# Patient Record
Sex: Male | Born: 1950
Health system: Southern US, Community
[De-identification: ages and names within clinical notes are randomized; demographics above are authoritative.]

## PROBLEM LIST (undated history)

## (undated) DIAGNOSIS — R682 Dry mouth, unspecified: Secondary | ICD-10-CM

## (undated) DIAGNOSIS — I10 Essential (primary) hypertension: Secondary | ICD-10-CM

## (undated) DIAGNOSIS — K117 Disturbances of salivary secretion: Secondary | ICD-10-CM

## (undated) DIAGNOSIS — E782 Mixed hyperlipidemia: Secondary | ICD-10-CM

## (undated) DIAGNOSIS — Z833 Family history of diabetes mellitus: Secondary | ICD-10-CM

## (undated) DIAGNOSIS — Z1211 Encounter for screening for malignant neoplasm of colon: Secondary | ICD-10-CM

## (undated) DIAGNOSIS — R7301 Impaired fasting glucose: Secondary | ICD-10-CM

## (undated) DIAGNOSIS — J45909 Unspecified asthma, uncomplicated: Secondary | ICD-10-CM

## (undated) DIAGNOSIS — R7303 Prediabetes: Secondary | ICD-10-CM

## (undated) DIAGNOSIS — H9319 Tinnitus, unspecified ear: Secondary | ICD-10-CM

## (undated) DIAGNOSIS — Z0000 Encounter for general adult medical examination without abnormal findings: Secondary | ICD-10-CM

## (undated) DIAGNOSIS — R252 Cramp and spasm: Secondary | ICD-10-CM

## (undated) HISTORY — DX: Encounter for screening for malignant neoplasm of colon: Z12.11

## (undated) HISTORY — DX: Impaired fasting glucose: R73.01

## (undated) HISTORY — DX: Mixed hyperlipidemia: E78.2

## (undated) HISTORY — DX: Tinnitus, unspecified ear: H93.19

## (undated) HISTORY — PX: RHYTIDECTOMY NECK / CHEEK / CHIN: SUR1286

## (undated) HISTORY — DX: Cramp and spasm: R25.2

## (undated) HISTORY — DX: Essential (primary) hypertension: I10

## (undated) HISTORY — DX: Dry mouth, unspecified: R68.2

## (undated) HISTORY — DX: Unspecified asthma, uncomplicated: J45.909

## (undated) HISTORY — DX: Family history of diabetes mellitus: Z83.3

## (undated) HISTORY — DX: Prediabetes: R73.03

## (undated) HISTORY — DX: Encounter for general adult medical examination without abnormal findings: Z00.00

## (undated) HISTORY — DX: Disturbances of salivary secretion: K11.7

---

## 2012-08-18 DIAGNOSIS — R7303 Prediabetes: Secondary | ICD-10-CM | POA: Insufficient documentation

## 2012-08-18 DIAGNOSIS — J45909 Unspecified asthma, uncomplicated: Secondary | ICD-10-CM

## 2012-08-18 DIAGNOSIS — R7301 Impaired fasting glucose: Secondary | ICD-10-CM | POA: Insufficient documentation

## 2012-08-18 DIAGNOSIS — Z833 Family history of diabetes mellitus: Secondary | ICD-10-CM

## 2012-08-18 DIAGNOSIS — I10 Essential (primary) hypertension: Secondary | ICD-10-CM | POA: Insufficient documentation

## 2012-08-18 DIAGNOSIS — H9319 Tinnitus, unspecified ear: Secondary | ICD-10-CM

## 2012-08-18 DIAGNOSIS — E782 Mixed hyperlipidemia: Secondary | ICD-10-CM

## 2012-08-18 HISTORY — DX: Family history of diabetes mellitus: Z83.3

## 2012-08-18 HISTORY — DX: Mixed hyperlipidemia: E78.2

## 2012-08-18 HISTORY — DX: Prediabetes: R73.03

## 2012-08-18 HISTORY — DX: Tinnitus, unspecified ear: H93.19

## 2012-08-18 HISTORY — DX: Impaired fasting glucose: R73.01

## 2012-08-18 HISTORY — DX: Essential (primary) hypertension: I10

## 2012-08-18 HISTORY — DX: Unspecified asthma, uncomplicated: J45.909

## 2013-02-17 DIAGNOSIS — Z1211 Encounter for screening for malignant neoplasm of colon: Secondary | ICD-10-CM

## 2013-02-17 DIAGNOSIS — Z0000 Encounter for general adult medical examination without abnormal findings: Secondary | ICD-10-CM

## 2013-02-17 HISTORY — DX: Encounter for screening for malignant neoplasm of colon: Z12.11

## 2013-02-17 HISTORY — DX: Encounter for general adult medical examination without abnormal findings: Z00.00

## 2014-09-17 DIAGNOSIS — R252 Cramp and spasm: Secondary | ICD-10-CM | POA: Insufficient documentation

## 2014-09-17 DIAGNOSIS — R682 Dry mouth, unspecified: Secondary | ICD-10-CM

## 2014-09-17 DIAGNOSIS — K117 Disturbances of salivary secretion: Secondary | ICD-10-CM | POA: Insufficient documentation

## 2014-09-17 HISTORY — DX: Dry mouth, unspecified: R68.2

## 2014-09-17 HISTORY — DX: Cramp and spasm: R25.2

## 2014-09-17 HISTORY — DX: Disturbances of salivary secretion: K11.7

## 2015-06-03 ENCOUNTER — Other Ambulatory Visit: Payer: Self-pay | Admitting: Nephrology

## 2015-06-03 DIAGNOSIS — N2889 Other specified disorders of kidney and ureter: Secondary | ICD-10-CM

## 2015-06-10 ENCOUNTER — Ambulatory Visit
Admission: RE | Admit: 2015-06-10 | Discharge: 2015-06-10 | Disposition: A | Discharge status: Home / Self Care | Payer: No Typology Code available for payment source | Source: Ambulatory Visit | Attending: Nephrology | Admitting: Nephrology

## 2015-06-10 DIAGNOSIS — N2889 Other specified disorders of kidney and ureter: Secondary | ICD-10-CM

## 2015-06-22 ENCOUNTER — Encounter: Payer: Self-pay | Admitting: Urology

## 2015-06-22 ENCOUNTER — Ambulatory Visit (INDEPENDENT_AMBULATORY_CARE_PROVIDER_SITE_OTHER): Payer: No Typology Code available for payment source | Admitting: Urology

## 2015-06-22 DIAGNOSIS — Q6100 Congenital renal cyst, unspecified: Secondary | ICD-10-CM

## 2015-06-22 DIAGNOSIS — N281 Cyst of kidney, acquired: Secondary | ICD-10-CM

## 2015-06-22 NOTE — Progress Notes (Signed)
06/22/2015 2:54 PM   Alejandro Evans 20-Feb-1951 [id number]  Referring provider: Marina Goodell, MD [address] [place], [place] [address]  Chief Complaint  Patient presents with  . Renal Cyst    New Patient    HPI:  The patient is a 64 year old male with no genitourinary past medical history presenting for evaluation of a 4.7 cm left renal cyst. It appears to be hemorrhagic on renal ultrasound and MRI without contrast. The patient cannot tolerate iodine contrast and has too low of GFR for gadolinium. He originally got the ultrasound for his CKD. He has had no symptoms. He denies any urinary issues including no frequency, urgency, incontinence, nocturia, hematuria, or erectile dysfunction.  PMH: Past Medical History  Diagnosis Date  . Benign essential HTN 08/18/2012  . 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 Metoprolol Will continue to follow Consider PFT once has insurance.    . Disturbance of salivary secretion 09/17/2014  . Dry mouth 09/17/2014    Last Assessment & Plan:  ? SE to metoprolol Will try biotene and consider changing regimen of bp meds once has insurance coverage.    . Elevated fasting blood sugar 08/18/2012    Last Assessment & Plan:  See htn tab   . Borderline diabetes 08/18/2012  . Cramps of lower extremity 09/17/2014    Last Assessment & Plan:  Likely due to new job and lisinopril hctz Coconut or tonic water at bedtime.    . Family history of diabetes mellitus 08/18/2012  . 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 Encounter for general adult medical examination without abnormal findings 02/17/2013    Last Assessment & Plan:  Generally he appears to be doing fairly well. I recommend a tetanus booster today. He was given information on shingles vaccination. He is due for colonoscopy and a referral was made. He is encouraged to continue exercising  regularly. Consider wearing a heart monitor.   . Encounter for screening for malignant neoplasm of colon 02/17/2013    Overview:  Last colonoscopy reportedly in 2009 in [place].  He believes that he had polyps.  We do not have a record of that examination.  Last Assessment & Plan:  Referral for screening colonoscopy.   . Buzzing in ear 08/18/2012    Surgical History: Past Surgical History  Procedure Laterality Date  . Rhytidectomy neck / cheek / chin      cyst removal from chin    Home Medications:    Medication List       This list is accurate as of: 06/22/15  2:54 PM.  Always use your most recent med list.               amLODipine 10 MG tablet  Commonly known as:  NORVASC  Take by mouth.     aspirin 81 MG chewable tablet  Chew by mouth.     D 1000 1000 UNITS capsule  Generic drug:  Cholecalciferol  Take by mouth.     lisinopril-hydrochlorothiazide 10-12.5 MG per tablet  Commonly known as:  PRINZIDE,ZESTORETIC  Take by mouth.     metoprolol 100 MG tablet  Commonly known as:  LOPRESSOR  Take by mouth.     pravastatin 40 MG tablet  Commonly known as:  PRAVACHOL  Take by mouth.     VENTOLIN HFA 108 (90 BASE) MCG/ACT inhaler  Generic drug:  albuterol  Inhale into the lungs.        Allergies:  Allergies  Allergen Reactions  . Iodinated Diagnostic Agents Swelling    Other reaction(s): SWELLING/EDEMA    Family History: Family History  Problem Relation Age of Onset  . Bladder Cancer Father   . Prostate cancer Neg Hx   . Kidney cancer Neg Hx   . Nephrolithiasis Brother     Social History:  reports that he has never smoked. He does not have any smokeless tobacco history on file. He reports that he drinks alcohol. He reports that he does not use illicit drugs.  ROS: UROLOGY Frequent Urination?: No Hard to postpone urination?: No Burning/pain with urination?: No Get up at night to urinate?: No Leakage of urine?: No Urine stream starts and stops?:  No Trouble starting stream?: No Do you have to strain to urinate?: No Blood in urine?: No Urinary tract infection?: No Sexually transmitted disease?: No Injury to kidneys or bladder?: No Painful intercourse?: No Weak stream?: No Erection problems?: No Penile pain?: No  Gastrointestinal Nausea?: No Vomiting?: No Indigestion/heartburn?: No Diarrhea?: No Constipation?: No  Constitutional Fever: No Night sweats?: No Weight loss?: No Fatigue?: No  Skin Skin rash/lesions?: No Itching?: No  Eyes Blurred vision?: No Double vision?: No  Ears/Nose/Throat Sore throat?: No Sinus problems?: No  Hematologic/Lymphatic Swollen glands?: No Easy bruising?: No  Cardiovascular Leg swelling?: No Chest pain?: No  Respiratory Cough?: Yes Shortness of breath?: No  Endocrine Excessive thirst?: No  Musculoskeletal Back pain?: No Joint pain?: No  Neurological Headaches?: No Dizziness?: No  Psychologic Depression?: No Anxiety?: No  Physical Exam: There were no vitals taken for this visit.  Constitutional:  Alert and oriented, No acute distress. HEENT: [place] AT, moist mucus membranes.  Trachea midline, no masses. Cardiovascular: No clubbing, cyanosis, or edema. Respiratory: Normal respiratory effort, no increased work of breathing. GI: Abdomen is soft, nontender, nondistended, no abdominal masses GU: No CVA tenderness. Normal phallus. Testicles descended equally bilaterally. No masses. Skin: No rashes, bruises or suspicious lesions. Lymph: No cervical or inguinal adenopathy. Neurologic: Grossly intact, no focal deficits, moving all 4 extremities. Psychiatric: Normal mood and affect.  Laboratory Data: No results found for: WBC, HGB, HCT, MCV, PLT  No results found for: CREATININE  No results found for: PSA  No results found for: TESTOSTERONE  No results found for: HGBA1C  Urinalysis No results found for: COLORURINE, APPEARANCEUR, LABSPEC, PHURINE, GLUCOSEU,  HGBUR, BILIRUBINUR, KETONESUR, PROTEINUR, UROBILINOGEN, NITRITE, LEUKOCYTESUR  Pertinent Imaging:  Study Result     CLINICAL DATA: LEFT renal lesion on ultrasound. Gadolinium contrast allergy.  EXAM: MRI ABDOMEN WITHOUT CONTRAST  TECHNIQUE: Multiplanar multisequence MR imaging was performed without the administration of intravenous contrast.  COMPARISON: None available  FINDINGS: Lower chest: Lung bases are clear.  Hepatobiliary: Multiple small benign-appearing cysts within liver parenchyma. Gallbladder is normal. Biliary duct dilatation. Common bile duct normal caliber.  Pancreas: Normal pancreatic parenchymal intensity. No ductal dilatation or inflammation.  Spleen: Normal spleen.  Adrenals/urinary tract: Adrenal glands normal.  Round exophytic lesion extending from the lateral cortex of the mid LEFT kidney measures 19 mm on image 80, series 9. This lesion has high signal intensity on the noncontrast T1 weighted imaging consistent internal blood are protein. Lesion is hyperintense on T2 weighted imaging consistent with internal blood product (image 24, series 10)  There is exophytic 4.7 cm cyst extending medial to the lower pole the LEFT kidney which has high signal on T2 weighted imaging consistent with typical  renal cyst. Similar typical renal cyst in the medial cortex of the LEFT kidney measuring 15 mm. In the central hilum of the RIGHT kidney 13 mm cyst with high T2 signal.  Stomach/Bowel: Stomach and limited of the small bowel is unremarkable  Vascular/Lymphatic: Abdominal aortic normal caliber. No retroperitoneal or periportal lymphadenopathy. Extensive venous collaterals inferior to the LEFT kidney along the aorta. 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  Musculoskeletal: No aggressive osseous lesion  IMPRESSION: 1. Exophytic round lesion extending from the mid LEFT renal cortex corresponds to the lesion concern on comparison ultrasound  report. No IV contrast was administered therefore lesion cannot be fully characterized however lesion has MRI imaging characteristics of a hemorrhagic cyst. 2. Additional bilateral typical fluid cysts. 3. 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     Assessment & Plan:    1. Left hemorrhagic renal cyst -follow up in 6 months  I discussed with the patient that ideally we would get imaging with contrast. However, he has a severe allergy to iodine contrast as well as a GFR only slightly greater than 30 which puts him at risk for nephrogenic systemic fibrosis with gadolinium contrast. We discussed that it is likely a hemorrhagic cyst, but it is difficult to tell for sure without contrast. I offered him either a repeat renal ultrasound in 6 months or a biopsy of the lesion. After discussion, we discussed repeat imaging in 6 months.  Follow Up: 6 months  Hildred Laser, MD  [facility] [address] [place], [place] [address] [phone]

## 2015-06-23 LAB — URINALYSIS, COMPLETE
Bilirubin, UA: POSITIVE — AB
Glucose, UA: NEGATIVE
Nitrite, UA: NEGATIVE
PH UA: 5 (ref 5.0–7.5)
RBC, UA: NEGATIVE
Specific Gravity, UA: 1.025 (ref 1.005–1.030)
Urobilinogen, Ur: 0.2 mg/dL (ref 0.2–1.0)

## 2015-06-23 LAB — MICROSCOPIC EXAMINATION
EPITHELIAL CELLS (NON RENAL): NONE SEEN /HPF (ref 0–10)
RBC, UA: NONE SEEN /hpf (ref 0–?)

## 2015-06-28 ENCOUNTER — Other Ambulatory Visit: Payer: Self-pay | Admitting: Urology

## 2015-06-28 DIAGNOSIS — N281 Cyst of kidney, acquired: Secondary | ICD-10-CM

## 2015-12-12 ENCOUNTER — Ambulatory Visit
Admission: RE | Admit: 2015-12-12 | Discharge: 2015-12-12 | Disposition: A | Discharge status: Home / Self Care | Payer: BLUE CROSS/BLUE SHIELD | Source: Ambulatory Visit | Attending: Urology | Admitting: Urology

## 2015-12-12 DIAGNOSIS — N281 Cyst of kidney, acquired: Secondary | ICD-10-CM

## 2015-12-12 DIAGNOSIS — Q6100 Congenital renal cyst, unspecified: Secondary | ICD-10-CM | POA: Insufficient documentation

## 2015-12-13 ENCOUNTER — Ambulatory Visit: Payer: No Typology Code available for payment source

## 2015-12-20 ENCOUNTER — Ambulatory Visit (INDEPENDENT_AMBULATORY_CARE_PROVIDER_SITE_OTHER): Payer: BLUE CROSS/BLUE SHIELD | Admitting: Urology

## 2015-12-20 ENCOUNTER — Encounter: Payer: Self-pay | Admitting: Urology

## 2015-12-20 VITALS — BP 105/69 | HR 66 | Ht 66.0 in | Wt 175.5 lb

## 2015-12-20 DIAGNOSIS — Q6100 Congenital renal cyst, unspecified: Secondary | ICD-10-CM | POA: Diagnosis not present

## 2015-12-20 DIAGNOSIS — N281 Cyst of kidney, acquired: Secondary | ICD-10-CM

## 2015-12-20 NOTE — Progress Notes (Signed)
12/20/2015 3:46 PM   Alejandro Evans 03/15/1951 [id number]  Referring provider: Marina Goodellale E Feldpausch, MD [address] [facility] [place] [facility] [place], [place] [id number]  Chief Complaint  Patient presents with  . Renal cyst    89month    HPI: Mr Alejandro Evans is a 64yo here for followup for left complex renal cyst. Repeat Renal US shows 2cm left mid pole renal cyst. He denies any LUTS, no hematuria. No flank pain   PMH: Past Medical History  Diagnosis Date  . Benign essential HTN 08/18/2012  . 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 Metoprolol Will continue to follow Consider PFT once has insurance.    . Disturbance of salivary secretion 09/17/2014  . Dry mouth 09/17/2014    Last Assessment & Plan:  ? SE to metoprolol Will try biotene and consider changing regimen of bp meds once has insurance coverage.    . Elevated fasting blood sugar 08/18/2012    Last Assessment & Plan:  See htn tab   . Borderline diabetes 08/18/2012  . Cramps of lower extremity 09/17/2014    Last Assessment & Plan:  Likely due to new job and lisinopril hctz Coconut or tonic water at bedtime.    . Family history of diabetes mellitus 08/18/2012  . 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 Encounter for general adult medical examination without abnormal findings 02/17/2013    Last Assessment & Plan:  Generally he appears to be doing fairly well. I recommend a tetanus booster today. He was given information on shingles vaccination. He is due for colonoscopy and a referral was made. He is encouraged to continue exercising regularly. Consider wearing a heart monitor.   . Encounter for screening for malignant neoplasm of colon 02/17/2013    Overview:  Last colonoscopy reportedly in 2009 in [place].  He believes that he had polyps.  We do not have a record of that examination.  Last Assessment &  Plan:  Referral for screening colonoscopy.   . Buzzing in ear 08/18/2012    Surgical History: Past Surgical History  Procedure Laterality Date  . Rhytidectomy neck / cheek / chin      cyst removal from chin    Home Medications:    Medication List       This list is accurate as of: 12/20/15  3:46 PM.  Always use your most recent med list.               amLODipine 10 MG tablet  Commonly known as:  NORVASC  Take by mouth.     aspirin 81 MG chewable tablet  Chew by mouth.     D 1000 1000 units capsule  Generic drug:  Cholecalciferol  Take by mouth.     lisinopril-hydrochlorothiazide 10-12.5 MG tablet  Commonly known as:  PRINZIDE,ZESTORETIC  Take by mouth.     metoprolol 100 MG tablet  Commonly known as:  LOPRESSOR  Take by mouth.     pravastatin 40 MG tablet  Commonly known as:  PRAVACHOL  Take by mouth.     VENTOLIN HFA 108 (90 Base) MCG/ACT inhaler  Generic drug:  albuterol  Inhale into the lungs.        Allergies:  Allergies  Allergen Reactions  . Iodinated Diagnostic Agents Swelling    Other reaction(s): SWELLING/EDEMA Other reaction(s): SWELLING/EDEMA    Family History: Family History  Problem Relation Age of Onset  . Bladder Cancer Father   . Prostate cancer Neg Hx   . Kidney cancer Neg Hx   . Nephrolithiasis Brother     Social History:  reports that he has never smoked. He does not have any smokeless tobacco history on file. He reports that he drinks alcohol. He reports that he does not use illicit drugs.  ROS: UROLOGY Frequent Urination?: No Hard to postpone urination?: No Burning/pain with urination?: No Get up at night to urinate?: No Leakage of urine?: No Urine stream starts and stops?: No Trouble starting stream?: No Do you have to strain to urinate?: No Blood in urine?: No Urinary tract infection?: No Sexually transmitted disease?: No Injury to kidneys or bladder?: No Painful intercourse?: No Weak stream?: Yes Erection  problems?: No Penile pain?: No  Gastrointestinal Nausea?: No Vomiting?: No Indigestion/heartburn?: No Diarrhea?: No Constipation?: No  Constitutional Fever: No Night sweats?: No Weight loss?: No Fatigue?: No  Skin Skin rash/lesions?: No Itching?: No  Eyes Blurred vision?: No Double vision?: No  Ears/Nose/Throat Sore throat?: Yes Sinus problems?: Yes  Hematologic/Lymphatic Swollen glands?: No Easy bruising?: No  Cardiovascular Leg swelling?: No Chest pain?: No  Respiratory Cough?: Yes Shortness of breath?: Yes  Endocrine Excessive thirst?: No  Musculoskeletal Back pain?: No Joint pain?: No  Neurological Headaches?: No Dizziness?: No  Psychologic Depression?: No Anxiety?: No  Physical Exam: BP 105/69 mmHg  Pulse 66  Ht  (1.676 m)  Wt 79.606 kg (175 lb 8 oz)  BMI 28.34 kg/m2  Constitutional:  Alert and oriented, No acute distress. HEENT: [place] AT, moist mucus membranes.  Trachea midline, no masses. Cardiovascular: No clubbing, cyanosis, or edema. Respiratory: Normal respiratory effort, no increased work of breathing. GI: Abdomen is soft, nontender, nondistended, no abdominal masses GU: No CVA tenderness.  Skin: No rashes, bruises or suspicious lesions. Lymph: No cervical or inguinal adenopathy. Neurologic: Grossly intact, no focal deficits, moving all 4 extremities. Psychiatric: Normal mood and affect.  Laboratory Data: No results found for: WBC, HGB, HCT, MCV, PLT  No results found for: CREATININE  No results found for: PSA  No results found for: TESTOSTERONE  No results found for: HGBA1C  Urinalysis    Component Value Date/Time   GLUCOSEU Negative 06/22/2015 1429   BILIRUBINUR Positive* 06/22/2015 1429   NITRITE Negative 06/22/2015 1429   LEUKOCYTESUR Trace* 06/22/2015 1429    Pertinent Imaging: Renal US  Assessment & Plan:    Left renal complex cyst -RTC 1 year with renal US  There are no diagnoses linked to this  encounter.  No Follow-up on file.  Malen Gauze, MD  [facility] [address], [address] [place], [place] [address] [phone]

## 2016-12-19 ENCOUNTER — Ambulatory Visit
Admission: RE | Admit: 2016-12-19 | Discharge: 2016-12-19 | Disposition: A | Discharge status: Home / Self Care | Payer: BLUE CROSS/BLUE SHIELD | Source: Ambulatory Visit | Attending: Urology | Admitting: Urology

## 2016-12-19 DIAGNOSIS — N281 Cyst of kidney, acquired: Secondary | ICD-10-CM | POA: Diagnosis present

## 2016-12-21 ENCOUNTER — Ambulatory Visit (INDEPENDENT_AMBULATORY_CARE_PROVIDER_SITE_OTHER): Payer: BLUE CROSS/BLUE SHIELD | Admitting: Urology

## 2016-12-21 ENCOUNTER — Encounter: Payer: Self-pay | Admitting: Urology

## 2016-12-21 VITALS — BP 132/87 | HR 105 | Ht 66.0 in | Wt 176.0 lb

## 2016-12-21 DIAGNOSIS — N281 Cyst of kidney, acquired: Secondary | ICD-10-CM

## 2016-12-21 NOTE — Progress Notes (Signed)
12/21/2016 4:01 PM   Alejandro Evans 07/22/1951 [id number]  Referring provider: Marina Goodellale E Feldpausch, MD [address] [facility] [place] [facility] [place], [place] [id number]  Chief Complaint  Patient presents with  . Follow-up    Renal US results    HPI: The patient is a 66 year old male with no genitourinary past medical history presenting for evaluation of a 4.7 cm left renal cyst. It appears to be hemorrhagic on renal ultrasound and MRI without contrast in September 2017. The patient cannot tolerate iodine contrast and has too low of GFR for gadolinium. He originally got the ultrasound for his CKD. He has had no symptoms. He denies any urinary issues including no frequency, urgency, incontinence, nocturia, hematuria, or erectile dysfunction.  He underwent a ultrasound in March 2017 which showed bilateral simple cysts. He returns today to discuss confirmatory ultrasound 1 year later that was performed in March 2018. This showed multiple cortical and parapelvic cysts. There is no complex appearing cystic structures observed. There is no hydronephrosis.   PMH: Past Medical History:  Diagnosis Date  . 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 Metoprolol Will continue to follow Consider PFT once has insurance.    . Benign essential HTN 08/18/2012  . Borderline diabetes 08/18/2012  . Buzzing in ear 08/18/2012  . 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 Cramps of lower extremity 09/17/2014   Last Assessment & Plan:  Likely due to new job and lisinopril hctz Coconut or tonic water at bedtime.    . Disturbance of salivary secretion 09/17/2014  . Dry mouth 09/17/2014   Last Assessment & Plan:  ? SE to metoprolol Will try biotene and consider changing regimen of bp meds once has insurance coverage.    . Elevated fasting blood sugar 08/18/2012   Last Assessment & Plan:  See htn  tab   . Encounter for general adult medical examination without abnormal findings 02/17/2013   Last Assessment & Plan:  Generally he appears to be doing fairly well. I recommend a tetanus booster today. He was given information on shingles vaccination. He is due for colonoscopy and a referral was made. He is encouraged to continue exercising regularly. Consider wearing a heart monitor.   . Encounter for screening for malignant neoplasm of colon 02/17/2013   Overview:  Last colonoscopy reportedly in 2009 in [place].  He believes that he had polyps.  We do not have a record of that examination.  Last Assessment & Plan:  Referral for screening colonoscopy.   . Family history of diabetes mellitus 08/18/2012    Surgical History: Past Surgical History:  Procedure Laterality Date  . RHYTIDECTOMY NECK / CHEEK / CHIN     cyst removal from chin    Home Medications:  Allergies as of 12/21/2016      Reactions   Iodinated Diagnostic Agents Swelling   Other reaction(s): SWELLING/EDEMA Other reaction(s): SWELLING/EDEMA      Medication List       Accurate as of 12/21/16  4:01 PM. Always use your most recent med list.          amLODipine 10 MG tablet Commonly known as:  NORVASC Take by mouth.   aspirin 81 MG chewable tablet Chew by mouth.   D 1000 1000 units capsule Generic drug:  Cholecalciferol Take by mouth.   lisinopril-hydrochlorothiazide 10-12.5 MG tablet Commonly known as:  PRINZIDE,ZESTORETIC Take by  mouth.   metoprolol 100 MG tablet Commonly known as:  LOPRESSOR Take by mouth.   pravastatin 40 MG tablet Commonly known as:  PRAVACHOL Take by mouth.   VENTOLIN HFA 108 (90 Base) MCG/ACT inhaler Generic drug:  albuterol Inhale into the lungs.       Allergies:  Allergies  Allergen Reactions  . Iodinated Diagnostic Agents Swelling    Other reaction(s): SWELLING/EDEMA Other reaction(s): SWELLING/EDEMA    Family History: Family History  Problem Relation Age of  Onset  . Bladder Cancer Father   . Prostate cancer Neg Hx   . Kidney cancer Neg Hx   . Nephrolithiasis Brother     Social History:  reports that he has never smoked. He has never used smokeless tobacco. He reports that he drinks alcohol. He reports that he does not use drugs.  ROS: UROLOGY Frequent Urination?: No Hard to postpone urination?: No Burning/pain with urination?: No Get up at night to urinate?: No Leakage of urine?: No Urine stream starts and stops?: No Trouble starting stream?: No Do you have to strain to urinate?: No Blood in urine?: No Urinary tract infection?: No Sexually transmitted disease?: No Injury to kidneys or bladder?: No Painful intercourse?: No Weak stream?: No Erection problems?: No Penile pain?: No  Gastrointestinal Nausea?: No Vomiting?: No Indigestion/heartburn?: No Diarrhea?: No Constipation?: No  Constitutional Fever: No Night sweats?: No Weight loss?: No Fatigue?: No  Skin Skin rash/lesions?: No Itching?: No  Eyes Blurred vision?: No Double vision?: No  Ears/Nose/Throat Sore throat?: No Sinus problems?: No  Hematologic/Lymphatic Swollen glands?: No Easy bruising?: No  Cardiovascular Leg swelling?: No Chest pain?: No  Respiratory Cough?: No Shortness of breath?: No  Endocrine Excessive thirst?: No  Musculoskeletal Back pain?: No Joint pain?: No  Neurological Headaches?: No Dizziness?: No  Psychologic Depression?: No Anxiety?: No  Physical Exam: BP 132/87 (BP Location: Left Arm, Patient Position: Sitting, Cuff Size: Normal)   Pulse (!) 105   Ht 5\' 6"  (1.676 m)   Wt 176 lb (79.8 kg)   BMI 28.41 kg/m   Constitutional:  Alert and oriented, No acute distress. HEENT: [place] AT, moist mucus membranes.  Trachea midline, no masses. Cardiovascular: No clubbing, cyanosis, or edema. Respiratory: Normal respiratory effort, no increased work of breathing. GI: Abdomen is soft, nontender, nondistended, no abdominal  masses GU: No CVA tenderness.  Skin: No rashes, bruises or suspicious lesions. Lymph: No cervical or inguinal adenopathy. Neurologic: Grossly intact, no focal deficits, moving all 4 extremities. Psychiatric: Normal mood and affect.  Laboratory Data: No results found for: WBC, HGB, HCT, MCV, PLT  No results found for: CREATININE  No results found for: PSA  No results found for: TESTOSTERONE  No results found for: HGBA1C  Urinalysis    Component Value Date/Time   APPEARANCEUR Clear 06/22/2015 1429   GLUCOSEU Negative 06/22/2015 1429   BILIRUBINUR Positive (A) 06/22/2015 1429   PROTEINUR Trace (A) 06/22/2015 1429   NITRITE Negative 06/22/2015 1429   LEUKOCYTESUR Trace (A) 06/22/2015 1429    Pertinent Imaging: CLINICAL DATA:  Follow-up renal cysts  EXAM: RENAL / URINARY TRACT ULTRASOUND COMPLETE  COMPARISON:  Renal ultrasound of December 12, 2015  FINDINGS: Right Kidney:  Length: 10.3 cm. The renal cortical echotexture remains lower than that of the adjacent liver. Two parapelvic cysts are observed. One in the mid-upper pole measures 2.3 cm in diameter while the second in the mid to lower pole measures 1.7 cm in diameter. There is no hydronephrosis.  Left Kidney:  Length: 11.6 cm. The renal cortical echotexture is similar to that on the right. A large exophytic cortical cyst measures 4 x 3.1 x 3.7 cm. A second cortically based midpole cyst measures 2.1 cm in diameter. A third cortical cyst in the lower pole measures 1.4 cm in diameter. A midpole parapelvic cyst measures 1.8 cm in diameter. There is no hydronephrosis. The partially distended urinary bladder is normal.  Bladder:  Multiple cortical and parapelvic cysts.  No complex  IMPRESSION: Multiple cortical and parapelvic cysts. No complex appearing cystic structures are observed. There is no hydronephrosis.   Assessment & Plan:    1. Bilateral simple renal cysts The patient was simple cyst on  repeat ultrasound. These are benign and do not need any further workup at this time. He can follow-up with our office as needed.  Return if symptoms worsen or fail to improve.  Hildred Laser, MD  [facility] [address], [address] [place], [place] [address] [phone]

## 2018-03-10 DIAGNOSIS — N183 Chronic kidney disease, stage 3 (moderate): Secondary | ICD-10-CM | POA: Diagnosis not present

## 2018-03-10 DIAGNOSIS — N2581 Secondary hyperparathyroidism of renal origin: Secondary | ICD-10-CM | POA: Diagnosis not present

## 2018-03-10 DIAGNOSIS — I10 Essential (primary) hypertension: Secondary | ICD-10-CM | POA: Diagnosis not present

## 2018-03-10 DIAGNOSIS — R809 Proteinuria, unspecified: Secondary | ICD-10-CM | POA: Diagnosis not present

## 2018-03-18 DIAGNOSIS — M170 Bilateral primary osteoarthritis of knee: Secondary | ICD-10-CM | POA: Diagnosis not present

## 2018-03-26 DIAGNOSIS — R231 Pallor: Secondary | ICD-10-CM | POA: Diagnosis not present

## 2018-05-21 DIAGNOSIS — Z125 Encounter for screening for malignant neoplasm of prostate: Secondary | ICD-10-CM | POA: Diagnosis not present

## 2018-05-21 DIAGNOSIS — K219 Gastro-esophageal reflux disease without esophagitis: Secondary | ICD-10-CM | POA: Diagnosis not present

## 2018-05-21 DIAGNOSIS — I10 Essential (primary) hypertension: Secondary | ICD-10-CM | POA: Diagnosis not present

## 2018-05-21 DIAGNOSIS — J4520 Mild intermittent asthma, uncomplicated: Secondary | ICD-10-CM | POA: Diagnosis not present

## 2018-05-21 DIAGNOSIS — E782 Mixed hyperlipidemia: Secondary | ICD-10-CM | POA: Diagnosis not present

## 2018-05-21 DIAGNOSIS — R7302 Impaired glucose tolerance (oral): Secondary | ICD-10-CM | POA: Diagnosis not present

## 2018-05-21 DIAGNOSIS — Z23 Encounter for immunization: Secondary | ICD-10-CM | POA: Diagnosis not present

## 2018-05-21 DIAGNOSIS — N183 Chronic kidney disease, stage 3 (moderate): Secondary | ICD-10-CM | POA: Diagnosis not present

## 2018-07-02 DIAGNOSIS — E782 Mixed hyperlipidemia: Secondary | ICD-10-CM | POA: Diagnosis not present

## 2018-07-02 DIAGNOSIS — R7302 Impaired glucose tolerance (oral): Secondary | ICD-10-CM | POA: Diagnosis not present

## 2018-07-02 DIAGNOSIS — Z125 Encounter for screening for malignant neoplasm of prostate: Secondary | ICD-10-CM | POA: Diagnosis not present

## 2018-07-04 DIAGNOSIS — N2581 Secondary hyperparathyroidism of renal origin: Secondary | ICD-10-CM | POA: Diagnosis not present

## 2018-07-04 DIAGNOSIS — N183 Chronic kidney disease, stage 3 (moderate): Secondary | ICD-10-CM | POA: Diagnosis not present

## 2018-07-04 DIAGNOSIS — R809 Proteinuria, unspecified: Secondary | ICD-10-CM | POA: Diagnosis not present

## 2018-07-04 DIAGNOSIS — I10 Essential (primary) hypertension: Secondary | ICD-10-CM | POA: Diagnosis not present

## 2018-09-16 DIAGNOSIS — M170 Bilateral primary osteoarthritis of knee: Secondary | ICD-10-CM | POA: Diagnosis not present

## 2018-11-06 DIAGNOSIS — N2581 Secondary hyperparathyroidism of renal origin: Secondary | ICD-10-CM | POA: Diagnosis not present

## 2018-11-06 DIAGNOSIS — I10 Essential (primary) hypertension: Secondary | ICD-10-CM | POA: Diagnosis not present

## 2018-11-06 DIAGNOSIS — N183 Chronic kidney disease, stage 3 (moderate): Secondary | ICD-10-CM | POA: Diagnosis not present

## 2018-11-06 DIAGNOSIS — R809 Proteinuria, unspecified: Secondary | ICD-10-CM | POA: Diagnosis not present

## 2018-11-25 DIAGNOSIS — I10 Essential (primary) hypertension: Secondary | ICD-10-CM | POA: Diagnosis not present

## 2018-11-25 DIAGNOSIS — N183 Chronic kidney disease, stage 3 (moderate): Secondary | ICD-10-CM | POA: Diagnosis not present

## 2018-11-25 DIAGNOSIS — E782 Mixed hyperlipidemia: Secondary | ICD-10-CM | POA: Diagnosis not present

## 2018-11-25 DIAGNOSIS — R7302 Impaired glucose tolerance (oral): Secondary | ICD-10-CM | POA: Diagnosis not present

## 2018-11-25 DIAGNOSIS — J4520 Mild intermittent asthma, uncomplicated: Secondary | ICD-10-CM | POA: Diagnosis not present

## 2018-11-25 DIAGNOSIS — K219 Gastro-esophageal reflux disease without esophagitis: Secondary | ICD-10-CM | POA: Diagnosis not present

## 2018-11-25 DIAGNOSIS — Z0000 Encounter for general adult medical examination without abnormal findings: Secondary | ICD-10-CM | POA: Diagnosis not present

## 2018-12-05 DIAGNOSIS — E782 Mixed hyperlipidemia: Secondary | ICD-10-CM | POA: Diagnosis not present

## 2018-12-05 DIAGNOSIS — R7302 Impaired glucose tolerance (oral): Secondary | ICD-10-CM | POA: Diagnosis not present

## 2019-03-04 DIAGNOSIS — I10 Essential (primary) hypertension: Secondary | ICD-10-CM | POA: Diagnosis not present

## 2019-03-04 DIAGNOSIS — N183 Chronic kidney disease, stage 3 (moderate): Secondary | ICD-10-CM | POA: Diagnosis not present

## 2019-03-04 DIAGNOSIS — R809 Proteinuria, unspecified: Secondary | ICD-10-CM | POA: Diagnosis not present

## 2019-03-04 DIAGNOSIS — N2581 Secondary hyperparathyroidism of renal origin: Secondary | ICD-10-CM | POA: Diagnosis not present

## 2019-05-27 DIAGNOSIS — K219 Gastro-esophageal reflux disease without esophagitis: Secondary | ICD-10-CM | POA: Diagnosis not present

## 2019-05-27 DIAGNOSIS — J4520 Mild intermittent asthma, uncomplicated: Secondary | ICD-10-CM | POA: Diagnosis not present

## 2019-05-27 DIAGNOSIS — I10 Essential (primary) hypertension: Secondary | ICD-10-CM | POA: Diagnosis not present

## 2019-05-27 DIAGNOSIS — N183 Chronic kidney disease, stage 3 (moderate): Secondary | ICD-10-CM | POA: Diagnosis not present

## 2019-05-27 DIAGNOSIS — E782 Mixed hyperlipidemia: Secondary | ICD-10-CM | POA: Diagnosis not present

## 2019-05-27 DIAGNOSIS — R7302 Impaired glucose tolerance (oral): Secondary | ICD-10-CM | POA: Diagnosis not present

## 2019-05-27 DIAGNOSIS — Z125 Encounter for screening for malignant neoplasm of prostate: Secondary | ICD-10-CM | POA: Diagnosis not present

## 2019-06-25 DIAGNOSIS — I10 Essential (primary) hypertension: Secondary | ICD-10-CM | POA: Diagnosis not present

## 2019-06-25 DIAGNOSIS — R809 Proteinuria, unspecified: Secondary | ICD-10-CM | POA: Diagnosis not present

## 2019-06-25 DIAGNOSIS — N183 Chronic kidney disease, stage 3 (moderate): Secondary | ICD-10-CM | POA: Diagnosis not present

## 2019-06-25 DIAGNOSIS — N2581 Secondary hyperparathyroidism of renal origin: Secondary | ICD-10-CM | POA: Diagnosis not present

## 2019-10-29 DIAGNOSIS — N2581 Secondary hyperparathyroidism of renal origin: Secondary | ICD-10-CM | POA: Diagnosis not present

## 2019-10-29 DIAGNOSIS — I10 Essential (primary) hypertension: Secondary | ICD-10-CM | POA: Diagnosis not present

## 2019-10-29 DIAGNOSIS — R809 Proteinuria, unspecified: Secondary | ICD-10-CM | POA: Diagnosis not present

## 2019-10-29 DIAGNOSIS — N1832 Chronic kidney disease, stage 3b: Secondary | ICD-10-CM | POA: Diagnosis not present

## 2019-10-29 DIAGNOSIS — R350 Frequency of micturition: Secondary | ICD-10-CM | POA: Diagnosis not present

## 2019-12-02 ENCOUNTER — Ambulatory Visit: Payer: Self-pay | Admitting: Urology

## 2019-12-02 ENCOUNTER — Encounter: Payer: Self-pay | Admitting: Urology

## 2019-12-03 DIAGNOSIS — E782 Mixed hyperlipidemia: Secondary | ICD-10-CM | POA: Diagnosis not present

## 2019-12-03 DIAGNOSIS — I129 Hypertensive chronic kidney disease with stage 1 through stage 4 chronic kidney disease, or unspecified chronic kidney disease: Secondary | ICD-10-CM | POA: Diagnosis not present

## 2019-12-03 DIAGNOSIS — J4520 Mild intermittent asthma, uncomplicated: Secondary | ICD-10-CM | POA: Diagnosis not present

## 2019-12-03 DIAGNOSIS — R7302 Impaired glucose tolerance (oral): Secondary | ICD-10-CM | POA: Diagnosis not present

## 2019-12-03 DIAGNOSIS — Z0000 Encounter for general adult medical examination without abnormal findings: Secondary | ICD-10-CM | POA: Diagnosis not present

## 2019-12-03 DIAGNOSIS — N1832 Chronic kidney disease, stage 3b: Secondary | ICD-10-CM | POA: Diagnosis not present

## 2019-12-03 DIAGNOSIS — K219 Gastro-esophageal reflux disease without esophagitis: Secondary | ICD-10-CM | POA: Diagnosis not present

## 2020-03-03 DIAGNOSIS — I10 Essential (primary) hypertension: Secondary | ICD-10-CM | POA: Diagnosis not present

## 2020-03-03 DIAGNOSIS — N2581 Secondary hyperparathyroidism of renal origin: Secondary | ICD-10-CM | POA: Diagnosis not present

## 2020-03-03 DIAGNOSIS — N1832 Chronic kidney disease, stage 3b: Secondary | ICD-10-CM | POA: Diagnosis not present

## 2020-03-03 DIAGNOSIS — R809 Proteinuria, unspecified: Secondary | ICD-10-CM | POA: Diagnosis not present

## 2020-06-10 DIAGNOSIS — Z20822 Contact with and (suspected) exposure to covid-19: Secondary | ICD-10-CM | POA: Diagnosis not present

## 2020-07-21 DIAGNOSIS — N1832 Chronic kidney disease, stage 3b: Secondary | ICD-10-CM | POA: Diagnosis not present

## 2020-07-21 DIAGNOSIS — R809 Proteinuria, unspecified: Secondary | ICD-10-CM | POA: Diagnosis not present

## 2020-07-21 DIAGNOSIS — I10 Essential (primary) hypertension: Secondary | ICD-10-CM | POA: Diagnosis not present

## 2020-07-21 DIAGNOSIS — N2581 Secondary hyperparathyroidism of renal origin: Secondary | ICD-10-CM | POA: Diagnosis not present

## 2020-08-05 DIAGNOSIS — I129 Hypertensive chronic kidney disease with stage 1 through stage 4 chronic kidney disease, or unspecified chronic kidney disease: Secondary | ICD-10-CM | POA: Diagnosis not present

## 2020-08-05 DIAGNOSIS — N1832 Chronic kidney disease, stage 3b: Secondary | ICD-10-CM | POA: Diagnosis not present

## 2020-08-05 DIAGNOSIS — K219 Gastro-esophageal reflux disease without esophagitis: Secondary | ICD-10-CM | POA: Diagnosis not present

## 2020-08-05 DIAGNOSIS — J4520 Mild intermittent asthma, uncomplicated: Secondary | ICD-10-CM | POA: Diagnosis not present

## 2020-08-05 DIAGNOSIS — E782 Mixed hyperlipidemia: Secondary | ICD-10-CM | POA: Diagnosis not present

## 2020-08-05 DIAGNOSIS — R7302 Impaired glucose tolerance (oral): Secondary | ICD-10-CM | POA: Diagnosis not present

## 2020-08-05 DIAGNOSIS — Z23 Encounter for immunization: Secondary | ICD-10-CM | POA: Diagnosis not present

## 2020-12-12 DIAGNOSIS — I10 Essential (primary) hypertension: Secondary | ICD-10-CM | POA: Diagnosis not present

## 2020-12-12 DIAGNOSIS — R809 Proteinuria, unspecified: Secondary | ICD-10-CM | POA: Diagnosis not present

## 2020-12-12 DIAGNOSIS — N1832 Chronic kidney disease, stage 3b: Secondary | ICD-10-CM | POA: Diagnosis not present

## 2020-12-12 DIAGNOSIS — N2581 Secondary hyperparathyroidism of renal origin: Secondary | ICD-10-CM | POA: Diagnosis not present

## 2021-02-13 DIAGNOSIS — N1832 Chronic kidney disease, stage 3b: Secondary | ICD-10-CM | POA: Diagnosis not present

## 2021-02-13 DIAGNOSIS — K219 Gastro-esophageal reflux disease without esophagitis: Secondary | ICD-10-CM | POA: Diagnosis not present

## 2021-02-13 DIAGNOSIS — I129 Hypertensive chronic kidney disease with stage 1 through stage 4 chronic kidney disease, or unspecified chronic kidney disease: Secondary | ICD-10-CM | POA: Diagnosis not present

## 2021-02-13 DIAGNOSIS — J4520 Mild intermittent asthma, uncomplicated: Secondary | ICD-10-CM | POA: Diagnosis not present

## 2021-02-13 DIAGNOSIS — Z0000 Encounter for general adult medical examination without abnormal findings: Secondary | ICD-10-CM | POA: Diagnosis not present

## 2021-02-13 DIAGNOSIS — R7302 Impaired glucose tolerance (oral): Secondary | ICD-10-CM | POA: Diagnosis not present

## 2021-02-13 DIAGNOSIS — E782 Mixed hyperlipidemia: Secondary | ICD-10-CM | POA: Diagnosis not present

## 2021-02-13 DIAGNOSIS — N2581 Secondary hyperparathyroidism of renal origin: Secondary | ICD-10-CM | POA: Diagnosis not present

## 2021-04-18 DIAGNOSIS — N2581 Secondary hyperparathyroidism of renal origin: Secondary | ICD-10-CM | POA: Diagnosis not present

## 2021-04-18 DIAGNOSIS — N1831 Chronic kidney disease, stage 3a: Secondary | ICD-10-CM | POA: Diagnosis not present

## 2021-04-18 DIAGNOSIS — R809 Proteinuria, unspecified: Secondary | ICD-10-CM | POA: Diagnosis not present

## 2021-04-18 DIAGNOSIS — N1832 Chronic kidney disease, stage 3b: Secondary | ICD-10-CM | POA: Diagnosis not present

## 2021-04-18 DIAGNOSIS — I10 Essential (primary) hypertension: Secondary | ICD-10-CM | POA: Diagnosis not present

## 2021-06-06 DIAGNOSIS — R059 Cough, unspecified: Secondary | ICD-10-CM | POA: Diagnosis not present

## 2021-06-06 DIAGNOSIS — R051 Acute cough: Secondary | ICD-10-CM | POA: Diagnosis not present

## 2021-06-06 DIAGNOSIS — J40 Bronchitis, not specified as acute or chronic: Secondary | ICD-10-CM | POA: Diagnosis not present

## 2021-08-28 DIAGNOSIS — N1831 Chronic kidney disease, stage 3a: Secondary | ICD-10-CM | POA: Diagnosis not present

## 2021-08-28 DIAGNOSIS — N2581 Secondary hyperparathyroidism of renal origin: Secondary | ICD-10-CM | POA: Diagnosis not present

## 2021-08-28 DIAGNOSIS — I10 Essential (primary) hypertension: Secondary | ICD-10-CM | POA: Diagnosis not present

## 2021-08-28 DIAGNOSIS — R809 Proteinuria, unspecified: Secondary | ICD-10-CM | POA: Diagnosis not present

## 2021-08-30 DIAGNOSIS — Z125 Encounter for screening for malignant neoplasm of prostate: Secondary | ICD-10-CM | POA: Diagnosis not present

## 2021-08-30 DIAGNOSIS — J4520 Mild intermittent asthma, uncomplicated: Secondary | ICD-10-CM | POA: Diagnosis not present

## 2021-08-30 DIAGNOSIS — K219 Gastro-esophageal reflux disease without esophagitis: Secondary | ICD-10-CM | POA: Diagnosis not present

## 2021-08-30 DIAGNOSIS — I129 Hypertensive chronic kidney disease with stage 1 through stage 4 chronic kidney disease, or unspecified chronic kidney disease: Secondary | ICD-10-CM | POA: Diagnosis not present

## 2021-08-30 DIAGNOSIS — N2581 Secondary hyperparathyroidism of renal origin: Secondary | ICD-10-CM | POA: Diagnosis not present

## 2021-08-30 DIAGNOSIS — N1832 Chronic kidney disease, stage 3b: Secondary | ICD-10-CM | POA: Diagnosis not present

## 2021-08-30 DIAGNOSIS — E782 Mixed hyperlipidemia: Secondary | ICD-10-CM | POA: Diagnosis not present

## 2021-08-30 DIAGNOSIS — R7302 Impaired glucose tolerance (oral): Secondary | ICD-10-CM | POA: Diagnosis not present

## 2021-11-22 DIAGNOSIS — R0789 Other chest pain: Secondary | ICD-10-CM | POA: Diagnosis not present

## 2021-11-22 DIAGNOSIS — R002 Palpitations: Secondary | ICD-10-CM | POA: Diagnosis not present

## 2021-11-28 DIAGNOSIS — I208 Other forms of angina pectoris: Secondary | ICD-10-CM | POA: Diagnosis not present

## 2021-11-28 DIAGNOSIS — E782 Mixed hyperlipidemia: Secondary | ICD-10-CM | POA: Diagnosis not present

## 2021-11-28 DIAGNOSIS — I10 Essential (primary) hypertension: Secondary | ICD-10-CM | POA: Diagnosis not present

## 2021-11-28 DIAGNOSIS — J45909 Unspecified asthma, uncomplicated: Secondary | ICD-10-CM | POA: Diagnosis not present

## 2021-11-28 DIAGNOSIS — K219 Gastro-esophageal reflux disease without esophagitis: Secondary | ICD-10-CM | POA: Diagnosis not present

## 2021-11-28 DIAGNOSIS — R9431 Abnormal electrocardiogram [ECG] [EKG]: Secondary | ICD-10-CM | POA: Diagnosis not present

## 2021-11-28 DIAGNOSIS — I447 Left bundle-branch block, unspecified: Secondary | ICD-10-CM | POA: Diagnosis not present

## 2021-11-28 DIAGNOSIS — R0602 Shortness of breath: Secondary | ICD-10-CM | POA: Diagnosis not present

## 2021-12-18 DIAGNOSIS — R0602 Shortness of breath: Secondary | ICD-10-CM | POA: Diagnosis not present

## 2021-12-25 DIAGNOSIS — H43393 Other vitreous opacities, bilateral: Secondary | ICD-10-CM | POA: Diagnosis not present

## 2021-12-25 DIAGNOSIS — H04123 Dry eye syndrome of bilateral lacrimal glands: Secondary | ICD-10-CM | POA: Diagnosis not present

## 2021-12-25 DIAGNOSIS — H2513 Age-related nuclear cataract, bilateral: Secondary | ICD-10-CM | POA: Diagnosis not present

## 2021-12-27 DIAGNOSIS — N1831 Chronic kidney disease, stage 3a: Secondary | ICD-10-CM | POA: Diagnosis not present

## 2021-12-27 DIAGNOSIS — R809 Proteinuria, unspecified: Secondary | ICD-10-CM | POA: Diagnosis not present

## 2021-12-27 DIAGNOSIS — I10 Essential (primary) hypertension: Secondary | ICD-10-CM | POA: Diagnosis not present

## 2021-12-27 DIAGNOSIS — N2581 Secondary hyperparathyroidism of renal origin: Secondary | ICD-10-CM | POA: Diagnosis not present

## 2022-01-10 DIAGNOSIS — R059 Cough, unspecified: Secondary | ICD-10-CM | POA: Diagnosis not present

## 2022-01-10 DIAGNOSIS — R051 Acute cough: Secondary | ICD-10-CM | POA: Diagnosis not present

## 2022-01-10 DIAGNOSIS — J4521 Mild intermittent asthma with (acute) exacerbation: Secondary | ICD-10-CM | POA: Diagnosis not present

## 2022-02-27 DIAGNOSIS — R7302 Impaired glucose tolerance (oral): Secondary | ICD-10-CM | POA: Diagnosis not present

## 2022-02-27 DIAGNOSIS — Z1389 Encounter for screening for other disorder: Secondary | ICD-10-CM | POA: Diagnosis not present

## 2022-02-27 DIAGNOSIS — N1832 Chronic kidney disease, stage 3b: Secondary | ICD-10-CM | POA: Diagnosis not present

## 2022-02-27 DIAGNOSIS — N2581 Secondary hyperparathyroidism of renal origin: Secondary | ICD-10-CM | POA: Diagnosis not present

## 2022-02-27 DIAGNOSIS — J4520 Mild intermittent asthma, uncomplicated: Secondary | ICD-10-CM | POA: Diagnosis not present

## 2022-02-27 DIAGNOSIS — E782 Mixed hyperlipidemia: Secondary | ICD-10-CM | POA: Diagnosis not present

## 2022-02-27 DIAGNOSIS — K219 Gastro-esophageal reflux disease without esophagitis: Secondary | ICD-10-CM | POA: Diagnosis not present

## 2022-02-27 DIAGNOSIS — I129 Hypertensive chronic kidney disease with stage 1 through stage 4 chronic kidney disease, or unspecified chronic kidney disease: Secondary | ICD-10-CM | POA: Diagnosis not present

## 2022-02-27 DIAGNOSIS — Z0000 Encounter for general adult medical examination without abnormal findings: Secondary | ICD-10-CM | POA: Diagnosis not present

## 2022-02-28 DIAGNOSIS — K219 Gastro-esophageal reflux disease without esophagitis: Secondary | ICD-10-CM | POA: Diagnosis not present

## 2022-02-28 DIAGNOSIS — N1832 Chronic kidney disease, stage 3b: Secondary | ICD-10-CM | POA: Diagnosis not present

## 2022-02-28 DIAGNOSIS — R002 Palpitations: Secondary | ICD-10-CM | POA: Diagnosis not present

## 2022-02-28 DIAGNOSIS — R079 Chest pain, unspecified: Secondary | ICD-10-CM | POA: Diagnosis not present

## 2022-02-28 DIAGNOSIS — J45909 Unspecified asthma, uncomplicated: Secondary | ICD-10-CM | POA: Diagnosis not present

## 2022-02-28 DIAGNOSIS — I10 Essential (primary) hypertension: Secondary | ICD-10-CM | POA: Diagnosis not present

## 2022-02-28 DIAGNOSIS — Z8679 Personal history of other diseases of the circulatory system: Secondary | ICD-10-CM | POA: Diagnosis not present

## 2022-02-28 DIAGNOSIS — E782 Mixed hyperlipidemia: Secondary | ICD-10-CM | POA: Diagnosis not present

## 2022-02-28 DIAGNOSIS — Z136 Encounter for screening for cardiovascular disorders: Secondary | ICD-10-CM | POA: Diagnosis not present

## 2022-03-01 ENCOUNTER — Other Ambulatory Visit: Payer: Self-pay | Admitting: Student

## 2022-03-01 DIAGNOSIS — Z136 Encounter for screening for cardiovascular disorders: Secondary | ICD-10-CM

## 2022-03-02 ENCOUNTER — Ambulatory Visit
Admission: RE | Admit: 2022-03-02 | Discharge: 2022-03-02 | Disposition: A | Discharge status: Home / Self Care | Payer: Self-pay | Source: Ambulatory Visit | Attending: Student | Admitting: Student

## 2022-03-02 DIAGNOSIS — Z136 Encounter for screening for cardiovascular disorders: Secondary | ICD-10-CM | POA: Insufficient documentation

## 2022-04-30 DIAGNOSIS — N1831 Chronic kidney disease, stage 3a: Secondary | ICD-10-CM | POA: Diagnosis not present

## 2022-04-30 DIAGNOSIS — I10 Essential (primary) hypertension: Secondary | ICD-10-CM | POA: Diagnosis not present

## 2022-04-30 DIAGNOSIS — R809 Proteinuria, unspecified: Secondary | ICD-10-CM | POA: Diagnosis not present

## 2022-04-30 DIAGNOSIS — N2581 Secondary hyperparathyroidism of renal origin: Secondary | ICD-10-CM | POA: Diagnosis not present

## 2022-05-30 DIAGNOSIS — R079 Chest pain, unspecified: Secondary | ICD-10-CM | POA: Diagnosis not present

## 2022-05-30 DIAGNOSIS — R002 Palpitations: Secondary | ICD-10-CM | POA: Diagnosis not present

## 2022-05-30 DIAGNOSIS — E782 Mixed hyperlipidemia: Secondary | ICD-10-CM | POA: Diagnosis not present

## 2022-05-30 DIAGNOSIS — J45909 Unspecified asthma, uncomplicated: Secondary | ICD-10-CM | POA: Diagnosis not present

## 2022-05-30 DIAGNOSIS — I10 Essential (primary) hypertension: Secondary | ICD-10-CM | POA: Diagnosis not present

## 2022-05-30 DIAGNOSIS — K219 Gastro-esophageal reflux disease without esophagitis: Secondary | ICD-10-CM | POA: Diagnosis not present

## 2022-05-30 DIAGNOSIS — N1832 Chronic kidney disease, stage 3b: Secondary | ICD-10-CM | POA: Diagnosis not present

## 2022-05-30 DIAGNOSIS — Z8679 Personal history of other diseases of the circulatory system: Secondary | ICD-10-CM | POA: Diagnosis not present

## 2022-05-30 DIAGNOSIS — R0602 Shortness of breath: Secondary | ICD-10-CM | POA: Diagnosis not present

## 2022-07-30 DIAGNOSIS — R051 Acute cough: Secondary | ICD-10-CM | POA: Diagnosis not present

## 2022-07-30 DIAGNOSIS — J40 Bronchitis, not specified as acute or chronic: Secondary | ICD-10-CM | POA: Diagnosis not present

## 2022-07-30 DIAGNOSIS — J4530 Mild persistent asthma, uncomplicated: Secondary | ICD-10-CM | POA: Diagnosis not present

## 2022-09-03 DIAGNOSIS — R809 Proteinuria, unspecified: Secondary | ICD-10-CM | POA: Diagnosis not present

## 2022-09-03 DIAGNOSIS — I10 Essential (primary) hypertension: Secondary | ICD-10-CM | POA: Diagnosis not present

## 2022-09-03 DIAGNOSIS — N1832 Chronic kidney disease, stage 3b: Secondary | ICD-10-CM | POA: Diagnosis not present

## 2022-09-03 DIAGNOSIS — N2581 Secondary hyperparathyroidism of renal origin: Secondary | ICD-10-CM | POA: Diagnosis not present

## 2022-09-05 DIAGNOSIS — E782 Mixed hyperlipidemia: Secondary | ICD-10-CM | POA: Diagnosis not present

## 2022-09-05 DIAGNOSIS — N2581 Secondary hyperparathyroidism of renal origin: Secondary | ICD-10-CM | POA: Diagnosis not present

## 2022-09-05 DIAGNOSIS — R590 Localized enlarged lymph nodes: Secondary | ICD-10-CM | POA: Diagnosis not present

## 2022-09-05 DIAGNOSIS — J4520 Mild intermittent asthma, uncomplicated: Secondary | ICD-10-CM | POA: Diagnosis not present

## 2022-09-05 DIAGNOSIS — K219 Gastro-esophageal reflux disease without esophagitis: Secondary | ICD-10-CM | POA: Diagnosis not present

## 2022-09-05 DIAGNOSIS — I129 Hypertensive chronic kidney disease with stage 1 through stage 4 chronic kidney disease, or unspecified chronic kidney disease: Secondary | ICD-10-CM | POA: Diagnosis not present

## 2022-09-05 DIAGNOSIS — N1832 Chronic kidney disease, stage 3b: Secondary | ICD-10-CM | POA: Diagnosis not present

## 2022-09-05 DIAGNOSIS — Z125 Encounter for screening for malignant neoplasm of prostate: Secondary | ICD-10-CM | POA: Diagnosis not present

## 2022-09-05 DIAGNOSIS — R7302 Impaired glucose tolerance (oral): Secondary | ICD-10-CM | POA: Diagnosis not present

## 2022-10-04 ENCOUNTER — Other Ambulatory Visit: Payer: Self-pay

## 2022-10-04 DIAGNOSIS — N1832 Chronic kidney disease, stage 3b: Secondary | ICD-10-CM | POA: Diagnosis not present

## 2022-10-04 DIAGNOSIS — R7302 Impaired glucose tolerance (oral): Secondary | ICD-10-CM | POA: Diagnosis not present

## 2022-10-04 DIAGNOSIS — J4520 Mild intermittent asthma, uncomplicated: Secondary | ICD-10-CM | POA: Diagnosis not present

## 2022-10-04 DIAGNOSIS — E782 Mixed hyperlipidemia: Secondary | ICD-10-CM | POA: Diagnosis not present

## 2022-10-04 DIAGNOSIS — R590 Localized enlarged lymph nodes: Secondary | ICD-10-CM

## 2022-10-04 DIAGNOSIS — I10 Essential (primary) hypertension: Secondary | ICD-10-CM

## 2022-10-05 ENCOUNTER — Other Ambulatory Visit: Payer: Self-pay | Admitting: Family Medicine

## 2022-10-05 DIAGNOSIS — I10 Essential (primary) hypertension: Secondary | ICD-10-CM

## 2022-10-05 DIAGNOSIS — R590 Localized enlarged lymph nodes: Secondary | ICD-10-CM

## 2022-10-10 ENCOUNTER — Ambulatory Visit
Admission: RE | Admit: 2022-10-10 | Discharge: 2022-10-10 | Disposition: A | Discharge status: Home / Self Care | Payer: Medicare HMO | Source: Ambulatory Visit | Attending: Family Medicine | Admitting: Family Medicine

## 2022-10-10 DIAGNOSIS — I10 Essential (primary) hypertension: Secondary | ICD-10-CM

## 2022-10-10 DIAGNOSIS — I700 Atherosclerosis of aorta: Secondary | ICD-10-CM | POA: Diagnosis not present

## 2022-10-10 DIAGNOSIS — R590 Localized enlarged lymph nodes: Secondary | ICD-10-CM

## 2023-01-02 DIAGNOSIS — N1832 Chronic kidney disease, stage 3b: Secondary | ICD-10-CM | POA: Diagnosis not present

## 2023-01-02 DIAGNOSIS — N1831 Chronic kidney disease, stage 3a: Secondary | ICD-10-CM | POA: Diagnosis not present

## 2023-01-02 DIAGNOSIS — N2581 Secondary hyperparathyroidism of renal origin: Secondary | ICD-10-CM | POA: Diagnosis not present

## 2023-01-02 DIAGNOSIS — I10 Essential (primary) hypertension: Secondary | ICD-10-CM | POA: Diagnosis not present

## 2023-01-02 DIAGNOSIS — R809 Proteinuria, unspecified: Secondary | ICD-10-CM | POA: Diagnosis not present

## 2023-04-30 DIAGNOSIS — Z0000 Encounter for general adult medical examination without abnormal findings: Secondary | ICD-10-CM | POA: Diagnosis not present

## 2023-04-30 DIAGNOSIS — N1832 Chronic kidney disease, stage 3b: Secondary | ICD-10-CM | POA: Diagnosis not present

## 2023-04-30 DIAGNOSIS — K219 Gastro-esophageal reflux disease without esophagitis: Secondary | ICD-10-CM | POA: Diagnosis not present

## 2023-04-30 DIAGNOSIS — J4520 Mild intermittent asthma, uncomplicated: Secondary | ICD-10-CM | POA: Diagnosis not present

## 2023-04-30 DIAGNOSIS — N2581 Secondary hyperparathyroidism of renal origin: Secondary | ICD-10-CM | POA: Diagnosis not present

## 2023-04-30 DIAGNOSIS — Z1331 Encounter for screening for depression: Secondary | ICD-10-CM | POA: Diagnosis not present

## 2023-04-30 DIAGNOSIS — I129 Hypertensive chronic kidney disease with stage 1 through stage 4 chronic kidney disease, or unspecified chronic kidney disease: Secondary | ICD-10-CM | POA: Diagnosis not present

## 2023-04-30 DIAGNOSIS — R7302 Impaired glucose tolerance (oral): Secondary | ICD-10-CM | POA: Diagnosis not present

## 2023-04-30 DIAGNOSIS — E782 Mixed hyperlipidemia: Secondary | ICD-10-CM | POA: Diagnosis not present

## 2023-05-01 DIAGNOSIS — R809 Proteinuria, unspecified: Secondary | ICD-10-CM | POA: Diagnosis not present

## 2023-05-01 DIAGNOSIS — N1831 Chronic kidney disease, stage 3a: Secondary | ICD-10-CM | POA: Diagnosis not present

## 2023-05-01 DIAGNOSIS — N2581 Secondary hyperparathyroidism of renal origin: Secondary | ICD-10-CM | POA: Diagnosis not present

## 2023-05-01 DIAGNOSIS — I10 Essential (primary) hypertension: Secondary | ICD-10-CM | POA: Diagnosis not present

## 2023-07-08 DIAGNOSIS — N1832 Chronic kidney disease, stage 3b: Secondary | ICD-10-CM | POA: Diagnosis not present

## 2023-07-08 DIAGNOSIS — E782 Mixed hyperlipidemia: Secondary | ICD-10-CM | POA: Diagnosis not present

## 2023-07-08 DIAGNOSIS — Z8679 Personal history of other diseases of the circulatory system: Secondary | ICD-10-CM | POA: Diagnosis not present

## 2023-07-08 DIAGNOSIS — K219 Gastro-esophageal reflux disease without esophagitis: Secondary | ICD-10-CM | POA: Diagnosis not present

## 2023-07-08 DIAGNOSIS — J45909 Unspecified asthma, uncomplicated: Secondary | ICD-10-CM | POA: Diagnosis not present

## 2023-07-08 DIAGNOSIS — R931 Abnormal findings on diagnostic imaging of heart and coronary circulation: Secondary | ICD-10-CM | POA: Diagnosis not present

## 2023-07-08 DIAGNOSIS — I10 Essential (primary) hypertension: Secondary | ICD-10-CM | POA: Diagnosis not present

## 2023-09-04 DIAGNOSIS — I10 Essential (primary) hypertension: Secondary | ICD-10-CM | POA: Diagnosis not present

## 2023-09-04 DIAGNOSIS — R600 Localized edema: Secondary | ICD-10-CM | POA: Diagnosis not present

## 2023-09-04 DIAGNOSIS — N2581 Secondary hyperparathyroidism of renal origin: Secondary | ICD-10-CM | POA: Diagnosis not present

## 2023-09-04 DIAGNOSIS — N182 Chronic kidney disease, stage 2 (mild): Secondary | ICD-10-CM | POA: Diagnosis not present

## 2023-09-09 DIAGNOSIS — J45909 Unspecified asthma, uncomplicated: Secondary | ICD-10-CM | POA: Diagnosis not present

## 2023-10-31 DIAGNOSIS — E782 Mixed hyperlipidemia: Secondary | ICD-10-CM | POA: Diagnosis not present

## 2023-10-31 DIAGNOSIS — N2581 Secondary hyperparathyroidism of renal origin: Secondary | ICD-10-CM | POA: Diagnosis not present

## 2023-10-31 DIAGNOSIS — Z23 Encounter for immunization: Secondary | ICD-10-CM | POA: Diagnosis not present

## 2023-10-31 DIAGNOSIS — I129 Hypertensive chronic kidney disease with stage 1 through stage 4 chronic kidney disease, or unspecified chronic kidney disease: Secondary | ICD-10-CM | POA: Diagnosis not present

## 2023-10-31 DIAGNOSIS — K219 Gastro-esophageal reflux disease without esophagitis: Secondary | ICD-10-CM | POA: Diagnosis not present

## 2023-10-31 DIAGNOSIS — R590 Localized enlarged lymph nodes: Secondary | ICD-10-CM | POA: Diagnosis not present

## 2023-10-31 DIAGNOSIS — N1831 Chronic kidney disease, stage 3a: Secondary | ICD-10-CM | POA: Diagnosis not present

## 2023-10-31 DIAGNOSIS — J45909 Unspecified asthma, uncomplicated: Secondary | ICD-10-CM | POA: Diagnosis not present

## 2023-10-31 DIAGNOSIS — R7302 Impaired glucose tolerance (oral): Secondary | ICD-10-CM | POA: Diagnosis not present

## 2023-11-04 ENCOUNTER — Other Ambulatory Visit: Payer: Self-pay | Admitting: Family Medicine

## 2023-11-04 DIAGNOSIS — R590 Localized enlarged lymph nodes: Secondary | ICD-10-CM

## 2023-11-04 DIAGNOSIS — Z125 Encounter for screening for malignant neoplasm of prostate: Secondary | ICD-10-CM | POA: Diagnosis not present

## 2023-11-04 DIAGNOSIS — R7302 Impaired glucose tolerance (oral): Secondary | ICD-10-CM | POA: Diagnosis not present

## 2023-11-04 DIAGNOSIS — E782 Mixed hyperlipidemia: Secondary | ICD-10-CM | POA: Diagnosis not present

## 2023-11-04 DIAGNOSIS — I10 Essential (primary) hypertension: Secondary | ICD-10-CM

## 2023-11-06 ENCOUNTER — Ambulatory Visit
Admission: RE | Admit: 2023-11-06 | Discharge: 2023-11-06 | Disposition: A | Discharge status: Home / Self Care | Payer: Medicare HMO | Source: Ambulatory Visit | Attending: Family Medicine | Admitting: Family Medicine

## 2023-11-06 DIAGNOSIS — I10 Essential (primary) hypertension: Secondary | ICD-10-CM | POA: Insufficient documentation

## 2023-11-06 DIAGNOSIS — R7302 Impaired glucose tolerance (oral): Secondary | ICD-10-CM | POA: Diagnosis not present

## 2023-11-06 DIAGNOSIS — R590 Localized enlarged lymph nodes: Secondary | ICD-10-CM | POA: Insufficient documentation

## 2023-11-06 DIAGNOSIS — E782 Mixed hyperlipidemia: Secondary | ICD-10-CM | POA: Diagnosis not present

## 2023-11-06 DIAGNOSIS — N1831 Chronic kidney disease, stage 3a: Secondary | ICD-10-CM | POA: Diagnosis not present

## 2023-11-06 DIAGNOSIS — K449 Diaphragmatic hernia without obstruction or gangrene: Secondary | ICD-10-CM | POA: Diagnosis not present

## 2023-11-07 DIAGNOSIS — R972 Elevated prostate specific antigen [PSA]: Secondary | ICD-10-CM | POA: Diagnosis not present

## 2023-11-07 DIAGNOSIS — N390 Urinary tract infection, site not specified: Secondary | ICD-10-CM | POA: Diagnosis not present

## 2023-12-18 DIAGNOSIS — R972 Elevated prostate specific antigen [PSA]: Secondary | ICD-10-CM | POA: Diagnosis not present

## 2024-01-01 DIAGNOSIS — N2581 Secondary hyperparathyroidism of renal origin: Secondary | ICD-10-CM | POA: Diagnosis not present

## 2024-01-01 DIAGNOSIS — I10 Essential (primary) hypertension: Secondary | ICD-10-CM | POA: Diagnosis not present

## 2024-01-01 DIAGNOSIS — N1831 Chronic kidney disease, stage 3a: Secondary | ICD-10-CM | POA: Diagnosis not present

## 2024-01-01 DIAGNOSIS — R600 Localized edema: Secondary | ICD-10-CM | POA: Diagnosis not present

## 2024-01-15 IMAGING — CT CT CARDIAC CORONARY ARTERY CALCIUM SCORE
3 series · 14 of 20 positions shown, 16 images · non-contrast
Comparison: None; correlation MR abdomen 06/10/2015

Addendum:
CLINICAL DATA: Risk stratification

EXAM:
Coronary Calcium Score
TECHNIQUE: The patient was scanned on a [employer] go.Top Scanner. Axial
non-contrast 3 mm slices were carried out through the heart. The
data set was analyzed on a dedicated work station and scored using
the Agatson method.

[Series 2: sa36 calcium scoring 3.00 · axial · 0.35mm/px · z∈[-1092,-1011]mm · 4 of 46 slices shown]
[im 10/46  vessel]
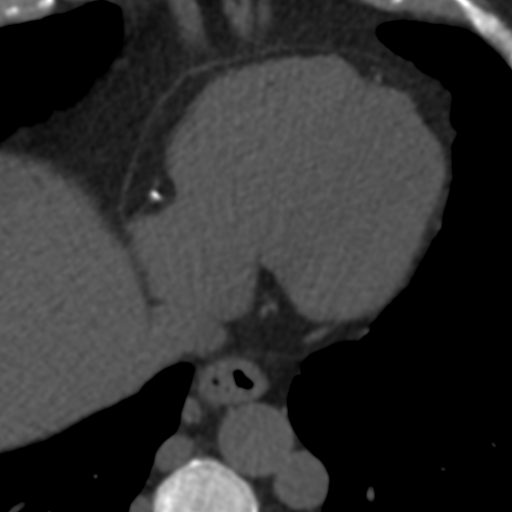
[im 19/46  vessel]
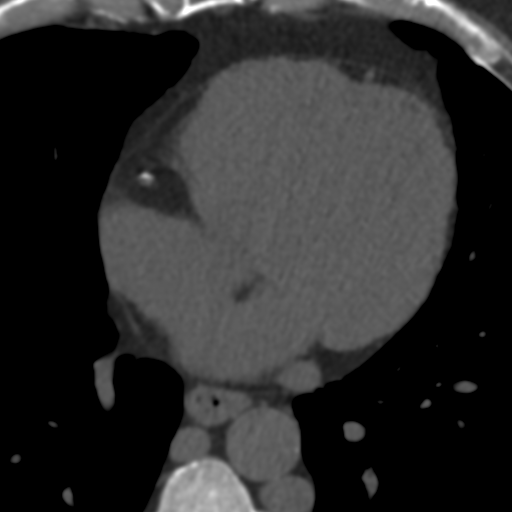
[im 28/46  vessel]
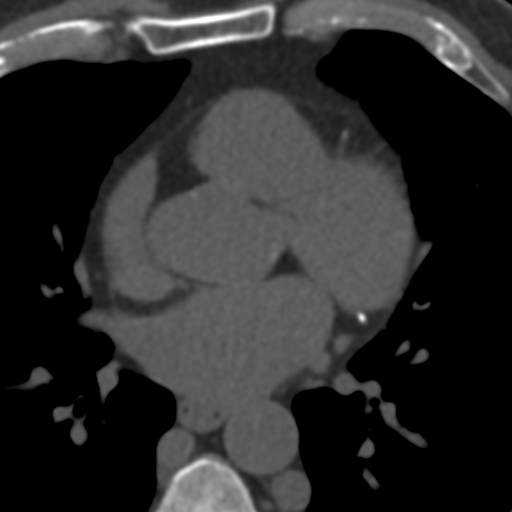
[im 37/46  vessel]
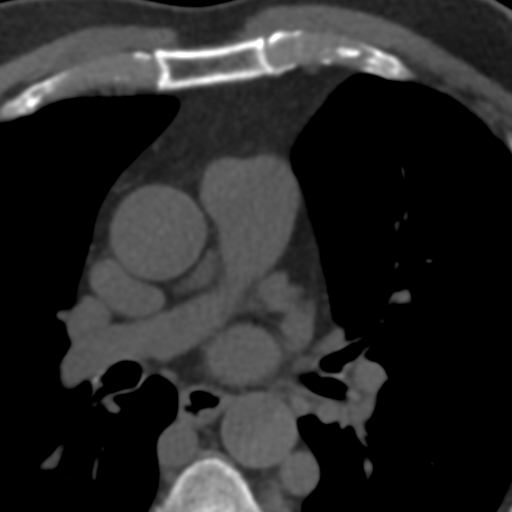

[Series 5: full fov st calcium scoring 3.00 · axial · 0.62mm/px · z∈[-1098,-1008]mm · 5 of 46 slices shown, 7 images]
[im 8/46  vessel]
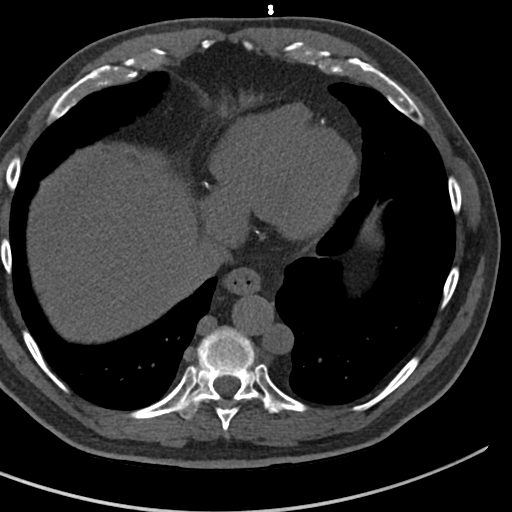
[im 8/46  lung]
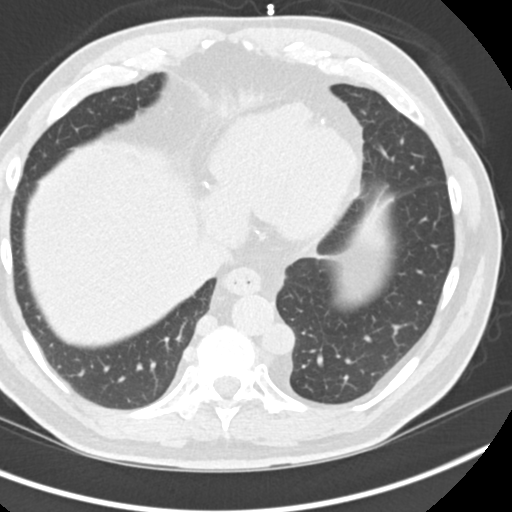
[im 16/46  vessel]
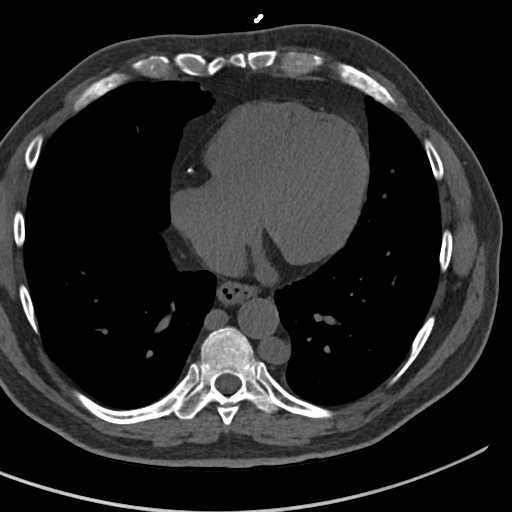
[im 23/46  vessel]
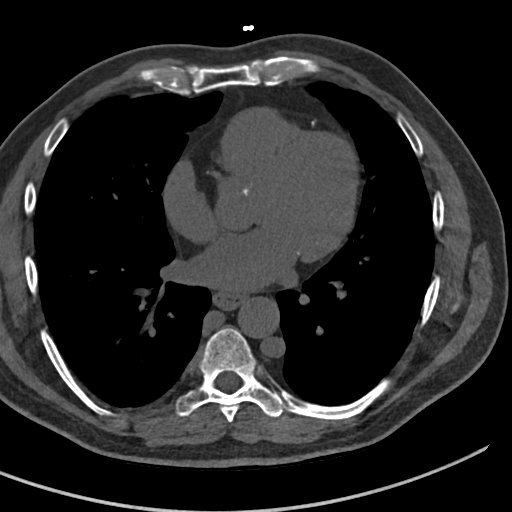
[im 31/46  vessel]
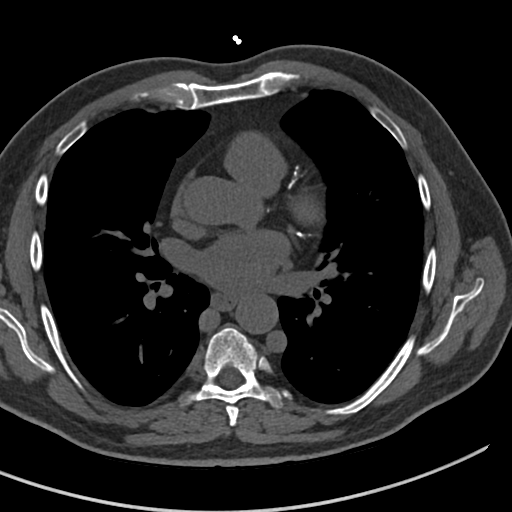
[im 38/46  vessel]
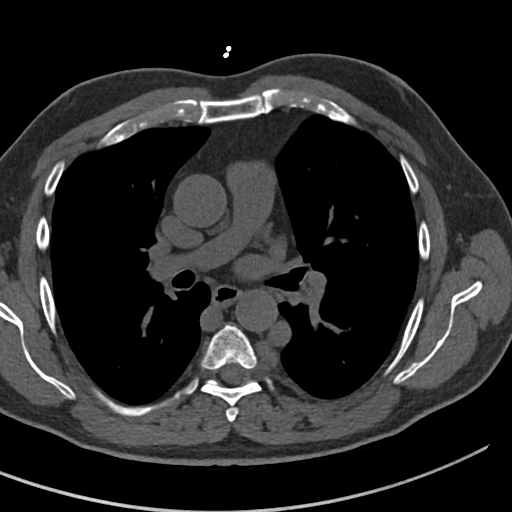
[im 38/46  lung]
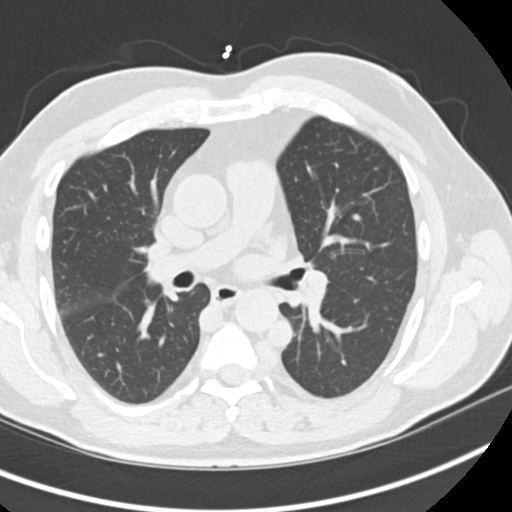

[Series 10: full fov lungs calcium scoring 3.00 ax · axial · 0.62mm/px · z∈[-1098,-1008]mm · 5 of 46 slices shown]
[im 8/46  vessel]
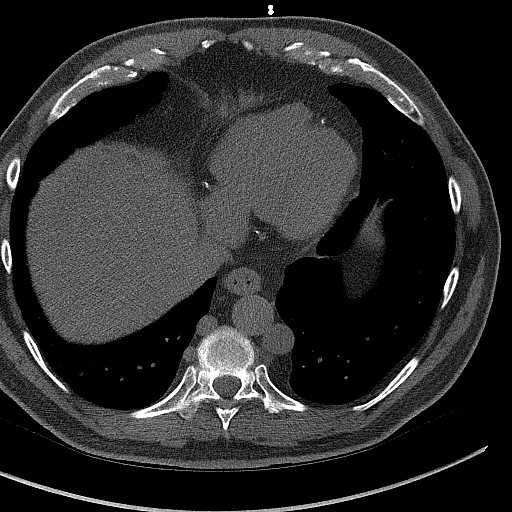
[im 16/46  vessel]
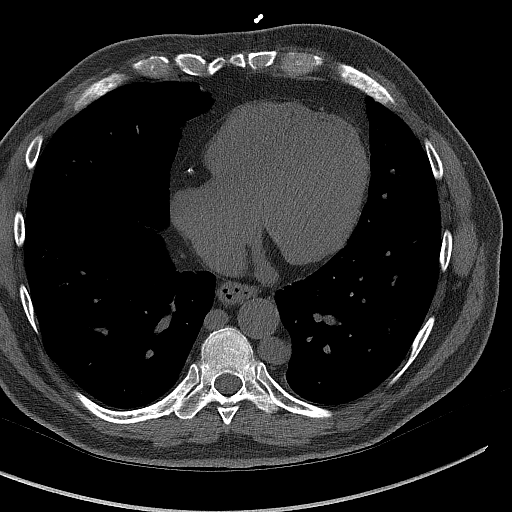
[im 23/46  vessel]
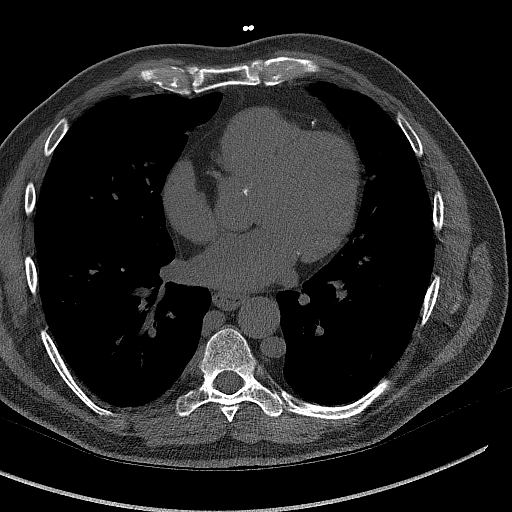
[im 31/46  vessel]
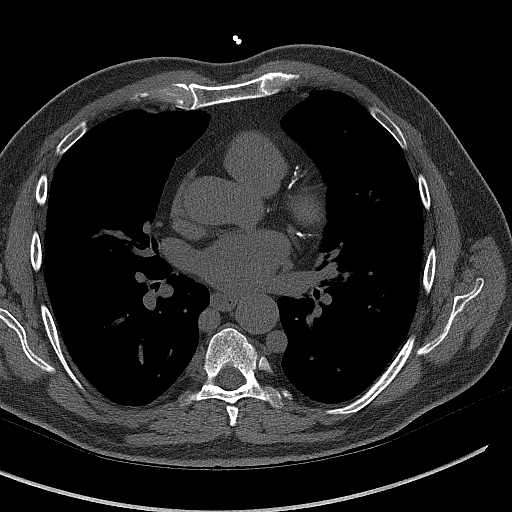
[im 38/46  vessel]
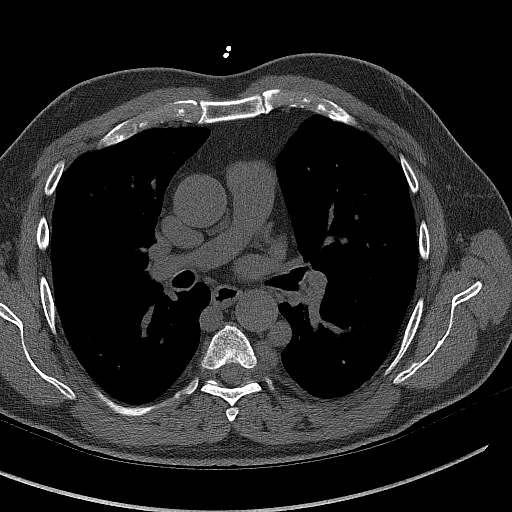

[14 of 20 positions shown; findings below may reference images not displayed]

FINDINGS: Non-cardiac: See separate report from [REDACTED].

Ascending Aorta: Normal size

Pericardium: Normal

Coronary arteries: Normal origin of left and right coronary
arteries. Distribution of arterial calcifications if present, as
noted below;

LM 0

LAD 252

LCx 173

RCA 256

Total 681

IMPRESSION AND RECOMMENDATION:
1. Coronary calcium score of 681. This was 80th percentile for age
and sex matched control.

2.  CAC >300 in LAD, LCx, RCA. SANDRA JANET EXCEL3/N3.

3.  Recommend aspirin and statin if no contraindication.

4.  Recommend cardiology consultation.

5.  Continue heart healthy lifestyle and risk factor modification.

EXAM:
OVER-READ INTERPRETATION  CT CHEST

The following report is an over-read performed by radiologist Dr.
Thimara Nama Das [[email or web address]] on 03/15/2022. This over-read
does not include interpretation of cardiac or coronary anatomy or
pathology. The coronary calcium score interpretation by the
cardiologist is attached.
FINDINGS: Ascending aorta normal caliber.

No pericardial effusion.

Enlarged azygos and hemiazygos veins, present since 0342.

Anterior mediastinal adenopathy 12 mm short axis image 1.

No additional enlarged lymph nodes seen.

Esophagus unremarkable.

Visualized upper abdomen normal appearance.

Lungs clear without infiltrate, pleural effusion, or mass.

Osseous structures unremarkable.
IMPRESSION: Anterior mediastinal adenopathy 12 mm short axis, nonspecific
recommend follow-up noncontrast CT chest in 3 months to reassess.

Enlarged azygos and hemiazygos veins, which by prior MR appears to
be related to an atretic IVC and hemi azygous continuation of a LEFT
IVC with multiple collaterals at the level of the LEFT kidney,
likely either congenital or the result of a prior occlusion.

*** End of Addendum ***
FINDINGS: Non-cardiac: See separate report from [REDACTED].

Ascending Aorta: Normal size

Pericardium: Normal

Coronary arteries: Normal origin of left and right coronary
arteries. Distribution of arterial calcifications if present, as
noted below;

LM 0

LAD 252

LCx 173

RCA 256

Total 681

IMPRESSION AND RECOMMENDATION:
1. Coronary calcium score of 681. This was 80th percentile for age
and sex matched control.

2.  CAC >300 in LAD, LCx, RCA. SANDRA JANET EXCEL3/N3.

3.  Recommend aspirin and statin if no contraindication.

4.  Recommend cardiology consultation.

5.  Continue heart healthy lifestyle and risk factor modification.

## 2024-05-05 DIAGNOSIS — N2581 Secondary hyperparathyroidism of renal origin: Secondary | ICD-10-CM | POA: Diagnosis not present

## 2024-05-05 DIAGNOSIS — R590 Localized enlarged lymph nodes: Secondary | ICD-10-CM | POA: Diagnosis not present

## 2024-05-05 DIAGNOSIS — K219 Gastro-esophageal reflux disease without esophagitis: Secondary | ICD-10-CM | POA: Diagnosis not present

## 2024-05-05 DIAGNOSIS — Z0000 Encounter for general adult medical examination without abnormal findings: Secondary | ICD-10-CM | POA: Diagnosis not present

## 2024-05-05 DIAGNOSIS — E782 Mixed hyperlipidemia: Secondary | ICD-10-CM | POA: Diagnosis not present

## 2024-05-05 DIAGNOSIS — R7302 Impaired glucose tolerance (oral): Secondary | ICD-10-CM | POA: Diagnosis not present

## 2024-05-05 DIAGNOSIS — I129 Hypertensive chronic kidney disease with stage 1 through stage 4 chronic kidney disease, or unspecified chronic kidney disease: Secondary | ICD-10-CM | POA: Diagnosis not present

## 2024-05-05 DIAGNOSIS — N1831 Chronic kidney disease, stage 3a: Secondary | ICD-10-CM | POA: Diagnosis not present

## 2024-05-05 DIAGNOSIS — J45909 Unspecified asthma, uncomplicated: Secondary | ICD-10-CM | POA: Diagnosis not present

## 2024-05-11 DIAGNOSIS — I10 Essential (primary) hypertension: Secondary | ICD-10-CM | POA: Diagnosis not present

## 2024-05-11 DIAGNOSIS — R809 Proteinuria, unspecified: Secondary | ICD-10-CM | POA: Diagnosis not present

## 2024-05-11 DIAGNOSIS — N2581 Secondary hyperparathyroidism of renal origin: Secondary | ICD-10-CM | POA: Diagnosis not present

## 2024-05-11 DIAGNOSIS — N1831 Chronic kidney disease, stage 3a: Secondary | ICD-10-CM | POA: Diagnosis not present

## 2024-08-24 DIAGNOSIS — R52 Pain, unspecified: Secondary | ICD-10-CM | POA: Diagnosis not present

## 2024-08-24 DIAGNOSIS — J029 Acute pharyngitis, unspecified: Secondary | ICD-10-CM | POA: Diagnosis not present

## 2024-08-24 DIAGNOSIS — J4531 Mild persistent asthma with (acute) exacerbation: Secondary | ICD-10-CM | POA: Diagnosis not present

## 2024-09-14 DIAGNOSIS — I10 Essential (primary) hypertension: Secondary | ICD-10-CM | POA: Diagnosis not present

## 2024-09-14 DIAGNOSIS — R809 Proteinuria, unspecified: Secondary | ICD-10-CM | POA: Diagnosis not present

## 2024-09-14 DIAGNOSIS — N1831 Chronic kidney disease, stage 3a: Secondary | ICD-10-CM | POA: Diagnosis not present

## 2024-09-14 DIAGNOSIS — N2581 Secondary hyperparathyroidism of renal origin: Secondary | ICD-10-CM | POA: Diagnosis not present
